# Patient Record
Sex: Male | Born: 1960 | Race: White | Hispanic: No | Marital: Single | Smoking: Never smoker
Health system: Southern US, Community
[De-identification: ages and names within clinical notes are randomized; demographics above are authoritative.]

## PROBLEM LIST (undated history)

## (undated) DIAGNOSIS — K259 Gastric ulcer, unspecified as acute or chronic, without hemorrhage or perforation: Secondary | ICD-10-CM

## (undated) HISTORY — PX: KNEE SURGERY: SHX244

## (undated) HISTORY — PX: HERNIA REPAIR: SHX51

---

## 2015-07-03 ENCOUNTER — Emergency Department (HOSPITAL_COMMUNITY): Payer: Self-pay

## 2015-07-03 ENCOUNTER — Encounter (HOSPITAL_COMMUNITY): Payer: Self-pay

## 2015-07-03 ENCOUNTER — Emergency Department (HOSPITAL_COMMUNITY)
Admission: EM | Admit: 2015-07-03 | Discharge: 2015-07-03 | Discharge status: Against Medical Advice | Payer: Self-pay | Attending: Emergency Medicine | Admitting: Emergency Medicine

## 2015-07-03 DIAGNOSIS — S4991XA Unspecified injury of right shoulder and upper arm, initial encounter: Secondary | ICD-10-CM

## 2015-07-03 DIAGNOSIS — W000XXA Fall on same level due to ice and snow, initial encounter: Secondary | ICD-10-CM | POA: Insufficient documentation

## 2015-07-03 DIAGNOSIS — Y998 Other external cause status: Secondary | ICD-10-CM | POA: Insufficient documentation

## 2015-07-03 DIAGNOSIS — W1789XA Other fall from one level to another, initial encounter: Secondary | ICD-10-CM | POA: Insufficient documentation

## 2015-07-03 DIAGNOSIS — M25511 Pain in right shoulder: Secondary | ICD-10-CM

## 2015-07-03 DIAGNOSIS — Z8719 Personal history of other diseases of the digestive system: Secondary | ICD-10-CM | POA: Insufficient documentation

## 2015-07-03 DIAGNOSIS — R52 Pain, unspecified: Secondary | ICD-10-CM

## 2015-07-03 DIAGNOSIS — Z79899 Other long term (current) drug therapy: Secondary | ICD-10-CM | POA: Insufficient documentation

## 2015-07-03 DIAGNOSIS — Y9289 Other specified places as the place of occurrence of the external cause: Secondary | ICD-10-CM | POA: Insufficient documentation

## 2015-07-03 DIAGNOSIS — Y9389 Activity, other specified: Secondary | ICD-10-CM | POA: Insufficient documentation

## 2015-07-03 DIAGNOSIS — S40911A Unspecified superficial injury of right shoulder, initial encounter: Secondary | ICD-10-CM | POA: Insufficient documentation

## 2015-07-03 DIAGNOSIS — W19XXXA Unspecified fall, initial encounter: Secondary | ICD-10-CM

## 2015-07-03 DIAGNOSIS — T85528A Displacement of other gastrointestinal prosthetic devices, implants and grafts, initial encounter: Secondary | ICD-10-CM

## 2015-07-03 HISTORY — DX: Gastric ulcer, unspecified as acute or chronic, without hemorrhage or perforation: K25.9

## 2015-07-03 LAB — CBC WITH DIFFERENTIAL/PLATELET
BASOS ABS: 0 10*3/uL (ref 0.0–0.1)
Basophils Relative: 0 %
EOS ABS: 0 10*3/uL (ref 0.0–0.7)
EOS PCT: 1 %
HCT: 41.1 % (ref 39.0–52.0)
Hemoglobin: 14 g/dL (ref 13.0–17.0)
LYMPHS PCT: 17 %
Lymphs Abs: 1 10*3/uL (ref 0.7–4.0)
MCH: 32.3 pg (ref 26.0–34.0)
MCHC: 34.1 g/dL (ref 30.0–36.0)
MCV: 94.9 fL (ref 78.0–100.0)
MONO ABS: 0.5 10*3/uL (ref 0.1–1.0)
Monocytes Relative: 8 %
Neutro Abs: 4.5 10*3/uL (ref 1.7–7.7)
Neutrophils Relative %: 74 %
PLATELETS: 170 10*3/uL (ref 150–400)
RBC: 4.33 MIL/uL (ref 4.22–5.81)
RDW: 12.7 % (ref 11.5–15.5)
WBC: 6.1 10*3/uL (ref 4.0–10.5)

## 2015-07-03 LAB — I-STAT CHEM 8, ED
BUN: 15 mg/dL (ref 6–20)
CALCIUM ION: 1.17 mmol/L (ref 1.12–1.23)
CREATININE: 1 mg/dL (ref 0.61–1.24)
Chloride: 107 mmol/L (ref 101–111)
GLUCOSE: 103 mg/dL — AB (ref 65–99)
HCT: 43 % (ref 39.0–52.0)
HEMOGLOBIN: 14.6 g/dL (ref 13.0–17.0)
Potassium: 4.1 mmol/L (ref 3.5–5.1)
Sodium: 142 mmol/L (ref 135–145)
TCO2: 24 mmol/L (ref 0–100)

## 2015-07-03 MED ORDER — IOHEXOL 300 MG/ML  SOLN
75.0000 mL | Freq: Once | INTRAMUSCULAR | Status: AC | PRN
  Administered 2015-07-03: 75 mL via INTRAVENOUS

## 2015-07-03 MED ORDER — HYDROMORPHONE HCL 2 MG/ML IJ SOLN
2.0000 mg | Freq: Once | INTRAMUSCULAR | Status: AC
  Administered 2015-07-03: 2 mg via INTRAVENOUS
  Filled 2015-07-03: qty 1

## 2015-07-03 MED ORDER — HYDROMORPHONE HCL 1 MG/ML IJ SOLN
1.0000 mg | Freq: Once | INTRAMUSCULAR | Status: AC
  Administered 2015-07-03: 1 mg via INTRAVENOUS
  Filled 2015-07-03: qty 1

## 2015-07-03 MED ORDER — FENTANYL CITRATE (PF) 100 MCG/2ML IJ SOLN
100.0000 ug | Freq: Once | INTRAMUSCULAR | Status: AC
  Administered 2015-07-03: 100 ug via INTRAVENOUS
  Filled 2015-07-03: qty 2

## 2015-07-03 MED ORDER — PROPOFOL 10 MG/ML IV BOLUS
20.0000 mg | Freq: Once | INTRAVENOUS | Status: AC
  Administered 2015-07-03: 70 mg via INTRAVENOUS
  Filled 2015-07-03: qty 20

## 2015-07-03 MED ORDER — ONDANSETRON HCL 4 MG/2ML IJ SOLN
4.0000 mg | Freq: Once | INTRAMUSCULAR | Status: AC
  Administered 2015-07-03: 4 mg via INTRAVENOUS
  Filled 2015-07-03: qty 2

## 2015-07-03 NOTE — ED Notes (Signed)
Awake. Verbally responsive. A/O x4. Resp even and unlabored. No audible adventitious breath sounds noted. ABC's intact. IV saline lock patent and intact. 

## 2015-07-03 NOTE — ED Notes (Signed)
Pt reported rt shoulder pain s/p to falling off back of truck. Noted deformity to rt shoulder, no bruising/swelling, (+)PMS, CRT brisk, LROM.

## 2015-07-03 NOTE — ED Notes (Signed)
CARELINK CALLED TO CANCEL [facility]SP[facility].

## 2015-07-03 NOTE — ED Notes (Signed)
CALL PLACED TO RONIE, CHARGE [facility]. RONIE INFORMED OF PT LEAVING THE FACILITY.

## 2015-07-03 NOTE — ED Notes (Signed)
[facility]

## 2015-07-03 NOTE — ED Notes (Signed)
UPON REEVALUATING THE PT, PT'S ROOM FOUND EMPTY WITH ALL BELONGINGS GONE. WILL RECHECK THE ROOM.

## 2015-07-03 NOTE — ED Notes (Signed)
Pt taken to CT scan and returned to room without distress noted. 

## 2015-07-03 NOTE — ED Notes (Addendum)
Patient fell on ice this AM. C/o right shoulder pain.

## 2015-07-03 NOTE — ED Notes (Addendum)
Pt signed consent form for reducing of rt scapula. Pt noted to have swelling of rt hand. (+)PMS, CRT brisk.

## 2015-07-03 NOTE — ED Provider Notes (Signed)
CSN: [id number]     Arrival date & time 07/03/15  0840 History   First MD Initiated Contact with Patient 07/03/15 [phone]     Chief Complaint  Patient presents with  . Shoulder Pain     HPI Patient fell off a truck and landed on right shoulder.  Presents with deformity to right shoulder.  No other complaints.  Denies hitting his head or neck.  Patient's had a history of rotator cuff surgery to same shoulder many years ago. Past Medical History  Diagnosis Date  . Multiple gastric ulcers    Past Surgical History  Procedure Laterality Date  . Hernia repair    . Knee surgery     Family History  Problem Relation Age of Onset  . Emphysema Father    Social History  Substance Use Topics  . Smoking status: Never Smoker   . Smokeless tobacco: Never Used  . Alcohol Use: No    Review of Systems  All other systems reviewed and are negative  Allergies  Review of patient's allergies indicates no known allergies.  Home Medications   Prior to Admission medications   Medication Sig Start Date End Date Taking? Authorizing Provider  LISINOPRIL PO Take 1 tablet by mouth daily.   Yes Historical Provider, MD   BP 143/95 mmHg  Pulse 77  Temp(Src) 98 F (36.7 C) (Oral)  Resp 16  SpO2 95% Physical Exam  Constitutional: He is oriented to person, place, and time. He appears well-developed and well-nourished. No distress.  HENT:  Head: Normocephalic and atraumatic.  Eyes: Pupils are equal, round, and reactive to light.  Neck: Normal range of motion.  Cardiovascular: Normal rate and intact distal pulses.   Pulses:      Radial pulses are 3+ on the right side, and 3+ on the left side.  Pulmonary/Chest: No respiratory distress.  Abdominal: Normal appearance. He exhibits no distension.  Musculoskeletal:       Right shoulder: He exhibits decreased range of motion and deformity.  No sensory or motor deficit to exam of right hand.  Obvious deformity to the right scapula along the posterior  thorax.  After approximately 2 hours right hand began to swell but continued to have good pulses and sensory exam was normal with no definite motor defect.  Neurological: He is alert and oriented to person, place, and time. No cranial nerve deficit.  Skin: Skin is warm and dry. No rash noted.  Psychiatric: He has a normal mood and affect. His behavior is normal.  Nursing note and vitals reviewed.   ED Course  .Sedation Date/Time: 07/03/2015 3:29 PM Performed by: Nelva NayBEATON, Deanndra Kirley Authorized by: Nelva NayBEATON, Kimmora Risenhoover  Consent:    Consent obtained:  Verbal and written   Consent given by:  Patient Indications:    Sedation purpose:  Dislocation reduction   Procedure necessitating sedation performed by:  Physician performing sedation   Intended level of sedation:  Moderate (conscious sedation) Pre-sedation assessment:    Pre-sedation assessments completed and reviewed: airway patency and mental status   Immediate pre-procedure details:    Reassessment: Patient reassessed immediately prior to procedure   Procedure details (see MAR for exact dosages):    Preoxygenation:  Nasal cannula   Sedation:  Propofol   Analgesia:  Fentanyl   Intra-procedure monitoring:  Blood pressure monitoring, cardiac monitor, continuous pulse oximetry and frequent vital sign checks Post-procedure details:    Attendance: Constant attendance by certified staff until patient recovered     Recovery: Patient returned to  pre-procedure baseline     Patient is stable for discharge or admission: Yes     Patient tolerance:  Tolerated well, no immediate complications  (including critical care time)  Medications  ondansetron (ZOFRAN) injection 4 mg (4 mg Intravenous Given 07/03/15 0943)  HYDROmorphone (DILAUDID) injection 2 mg (2 mg Intravenous Given 07/03/15 0943)  fentaNYL (SUBLIMAZE) injection 100 mcg (100 mcg Intravenous Given 07/03/15 1023)  iohexol (OMNIPAQUE) 300 MG/ML solution 75 mL (75 mLs Intravenous Contrast Given 07/03/15 1146)   HYDROmorphone (DILAUDID) injection 1 mg (1 mg Intravenous Given 07/03/15 1207)  propofol (DIPRIVAN) 10 mg/mL bolus/IV push 20 mg (0 mg Intravenous Stopped 07/03/15 1421)  fentaNYL (SUBLIMAZE) injection 100 mcg (100 mcg Intravenous Given 07/03/15 1413)  HYDROmorphone (DILAUDID) injection 2 mg (2 mg Intravenous Given 07/03/15 1621)    Labs Review Labs Reviewed  I-STAT CHEM 8, ED - Abnormal; Notable for the following:    Glucose, Bld 103 (*)    All other components within normal limits  CBC WITH DIFFERENTIAL/PLATELET    Imaging Review No results found. I have personally reviewed and evaluated these images and lab results as part of my medical decision-making. I discussed with the orthopedic surgeon who reviewed both plain films and CT scan.  He recommended patient be transferred to [facility].  Dr. Cherly Hensen from the trauma service was contacted and accepted the patient.  Will send the patient to the emergency room where Dr. Cherly Hensen will be called.   MDM   Final diagnoses:  Fall  Injury of scapular region, right, initial encounter        Nelva Nay, MD 07/13/15 2202

## 2015-07-03 NOTE — ED Notes (Addendum)
SECOND CHECK OF PT'S ROOM EMPTY. WAITING ROOM LOBBY AND OUTSIDE CHECKED WITH NO SIGN OF THE PT. 2 ATTEMPTS TO CALL THE PT, THE PHONE NUMBER ON FILE IS DISCONNECTED. THERE IS ALSO NO SIGN OF THE [facility] PERIPHERAL IV IN THE ROOM. CHARGE NURSE MADE AWARE.

## 2015-07-03 NOTE — ED Notes (Addendum)
Dr. Radford PaxBeaton and Dr. Fayrene FearingJames and Livia SnellenBenedict, ortho tech at bedside to reduce rt scapula. At At 1421 given Propofol 70mg  IV push via Dr. Radford PaxBeaton. MD's attempted to reduce rt scapula and was unsuccessful. At 1426 Pt awake. Verbally responsive. A/O x4. Resp even and unlabored. No audible adventitious breath sounds noted. SR on monitor. ABC's intact. IV saline lock patent and intact.VSS.

## 2017-07-01 IMAGING — CT CT SHOULDER*R* W/O CM
3 series · 15 of 33 positions shown, 18 images · non-contrast
Comparison: None.

CLINICAL DATA: Status post fall on ice this morning. Right shoulder
pain and retracted right scapula.

EXAM:
CT OF THE RIGHT SHOULDER WITHOUT CONTRAST
TECHNIQUE: Multidetector CT imaging was performed according to the standard
protocol. Multiplanar CT image reconstructions were also generated.

[Series 3: pelvis st · axial · 0.45mm/px · z∈[+1619,+1789]mm · 7 of 101 slices shown, 9 images]
[im 8/101  soft-tissue]
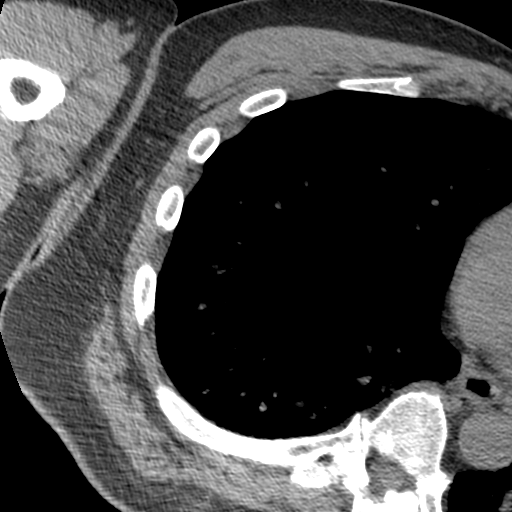
[im 8/101  bone]
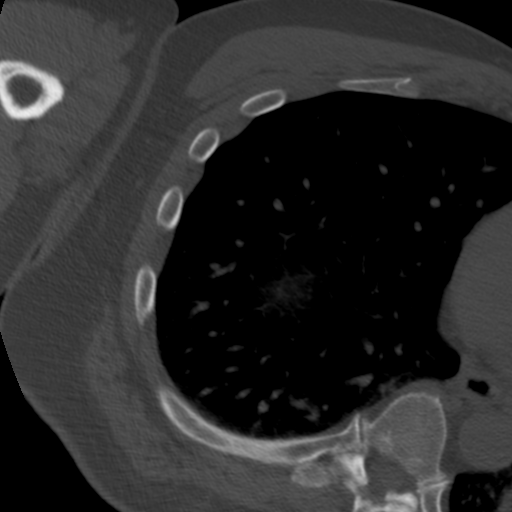
[im 24/101  bone]
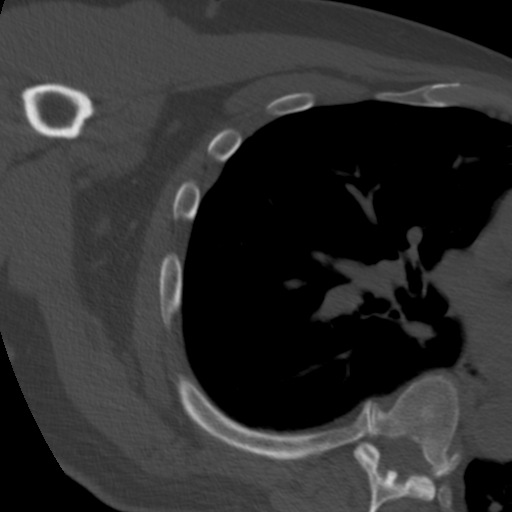
[im 39/101  bone]
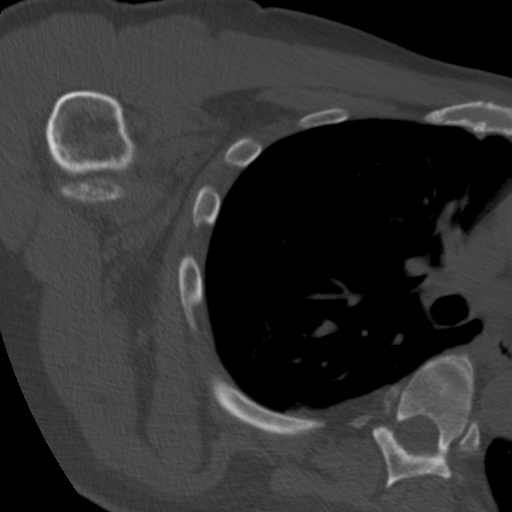
[im 54/101  bone]
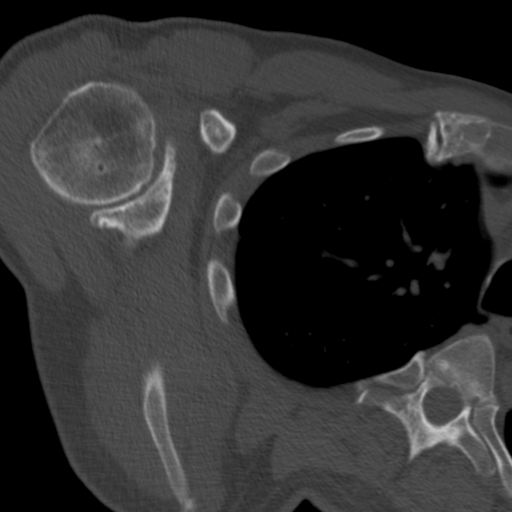
[im 62/101  soft-tissue]
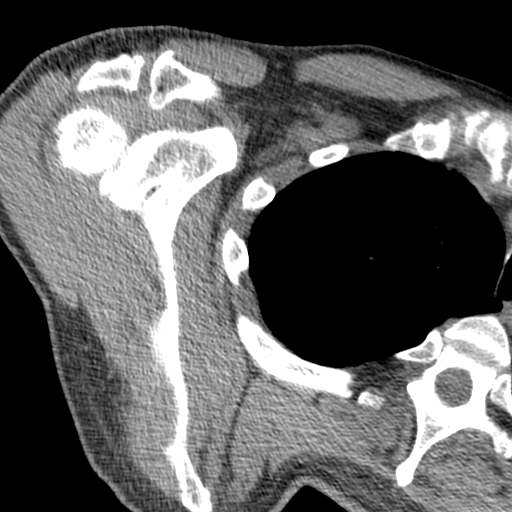
[im 62/101  bone]
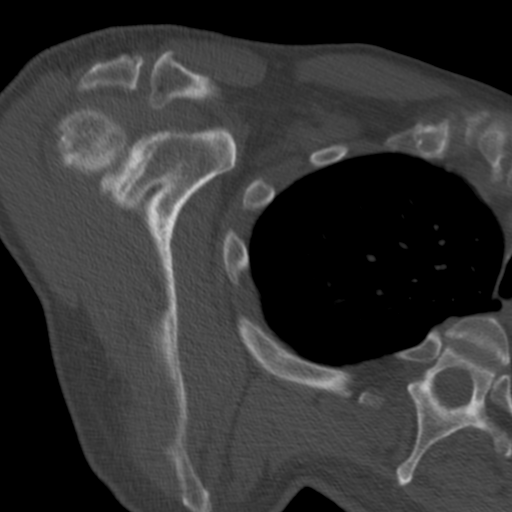
[im 77/101  bone]
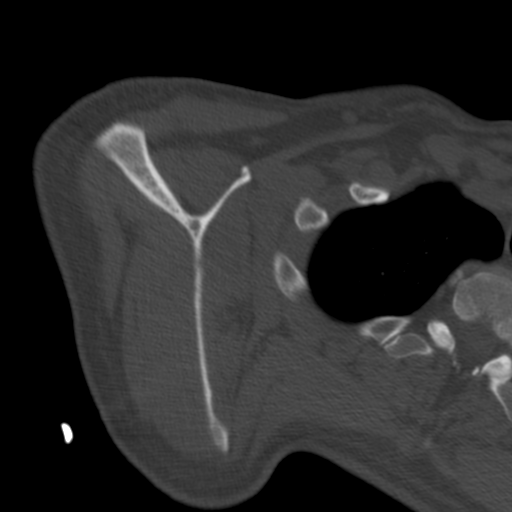
[im 93/101  bone]
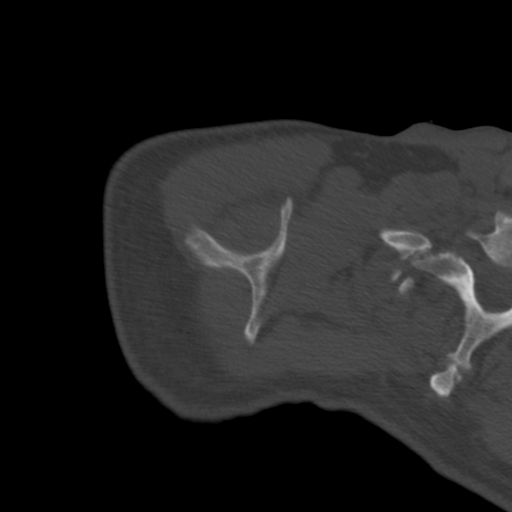

[Series 7: coronal images · coronal · 0.39mm/px · 3 of 131 slices shown]
[im 27/131  bone]
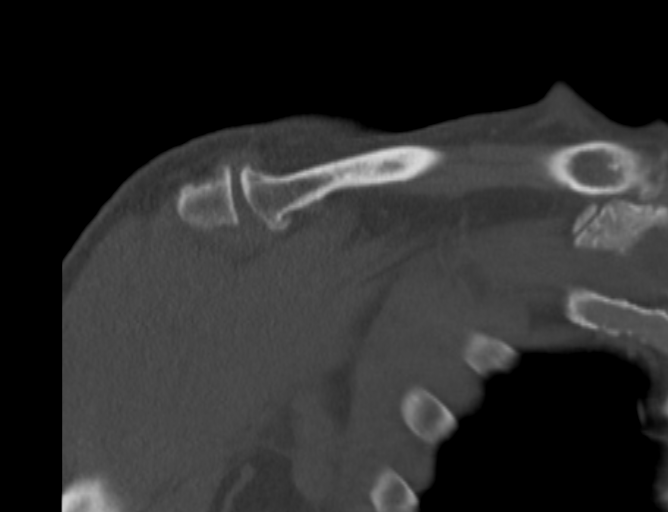
[im 53/131  bone]
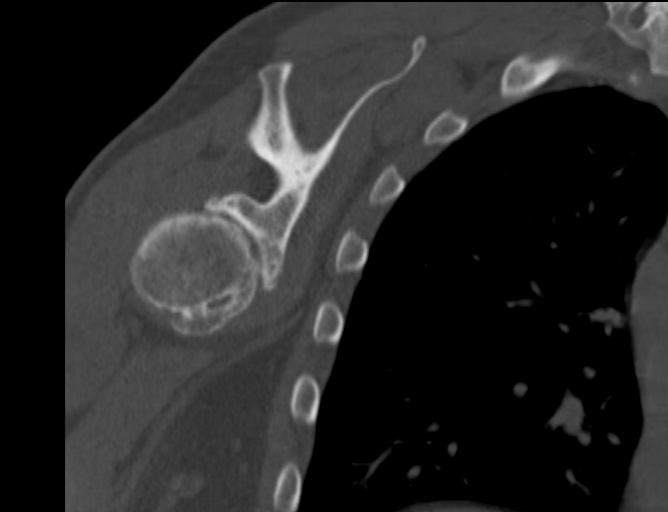
[im 79/131  bone]
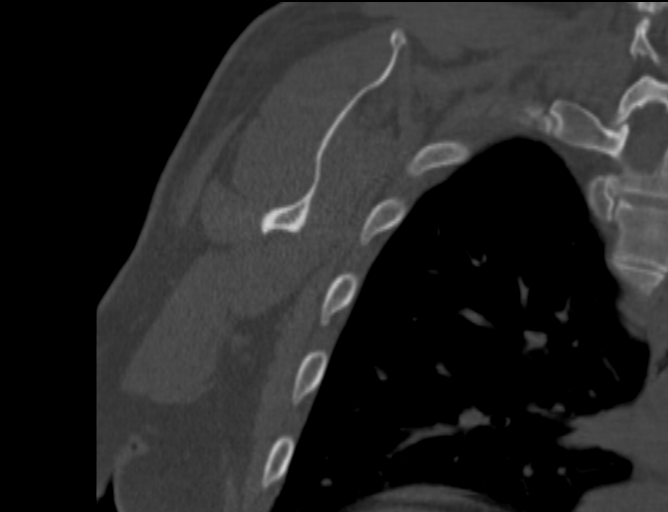

[Series 8: sagittal images · sagittal · 0.38mm/px · 5 of 126 slices shown, 6 images]
[im 42/126  bone]
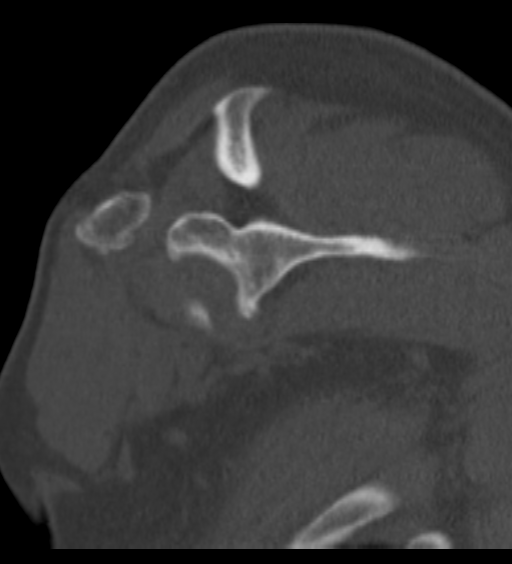
[im 53/126  bone]
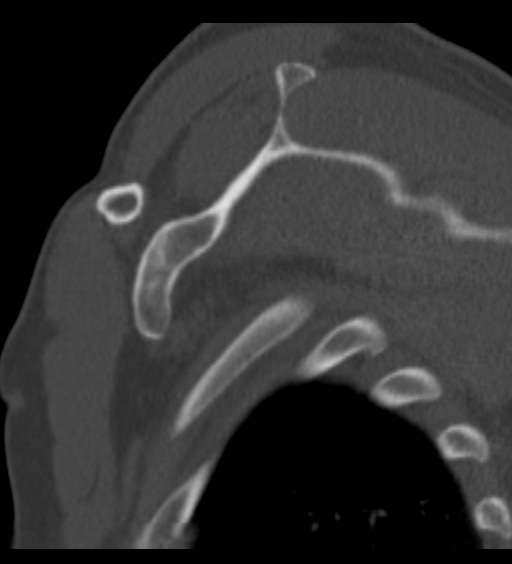
[im 63/126  soft-tissue]
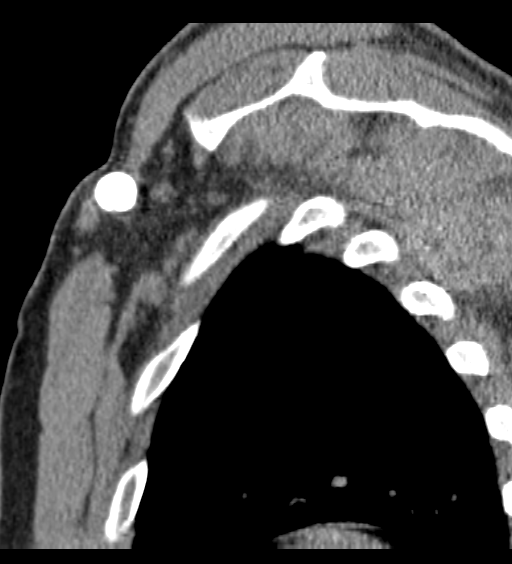
[im 63/126  bone]
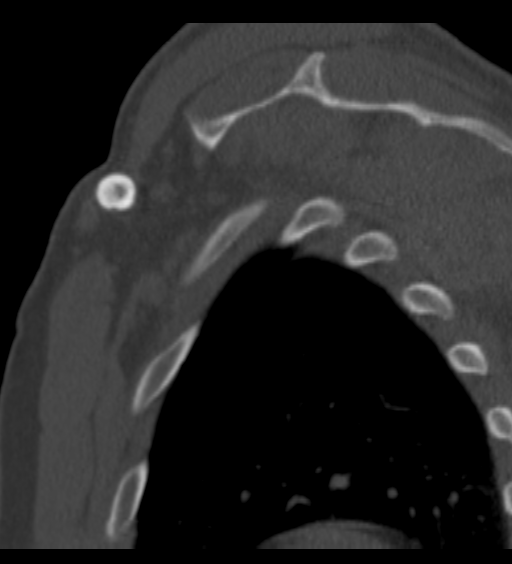
[im 73/126  bone]
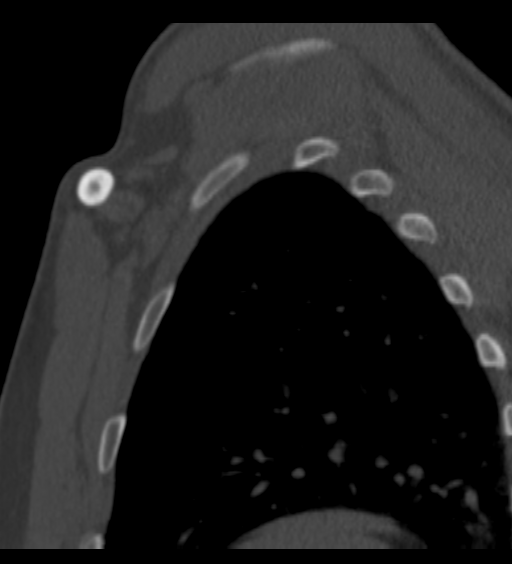
[im 84/126  bone]
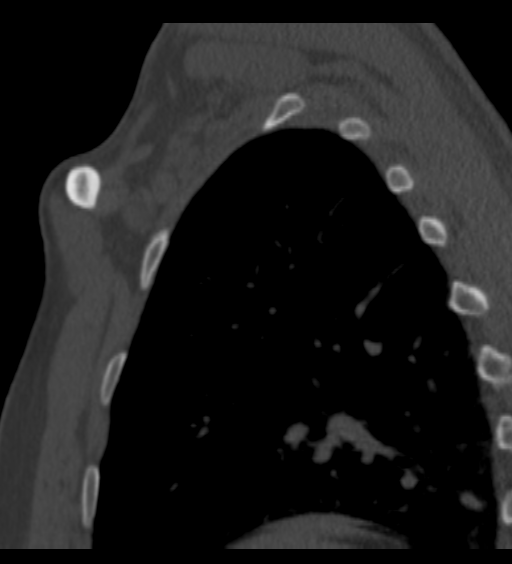

[15 of 33 positions shown; findings below may reference images not displayed]

FINDINGS: There is no fracture. The humeral head is located. The patient has
advanced for age glenohumeral osteoarthritis with joint space
narrowing and remodeling of the glenoid. Acromioclavicular
osteoarthritis is also seen. The medial border of the scapula is
laterally rotated or "winged". All imaged musculature appears
intact. Imaged lung parenchyma is clear.
IMPRESSION: Negative for fracture.

Winging of the right scapula is likely a chronic abnormality and is
typically seen in injuries to the long thoracic nerve which can be
traumatic or idiopathic.

Advanced for age glenohumeral osteoarthritis. Acromioclavicular
osteoarthritis is also seen.

## 2017-07-01 IMAGING — CR DG SHOULDER 2+V*R*
3 series · 3 of 3 positions shown · non-contrast
Comparison: None.

CLINICAL DATA: Patient with diffuse right shoulder pain status post
fall. Initial encounter.

EXAM:
RIGHT SHOULDER - 2+ VIEW

[w shoulder external right]
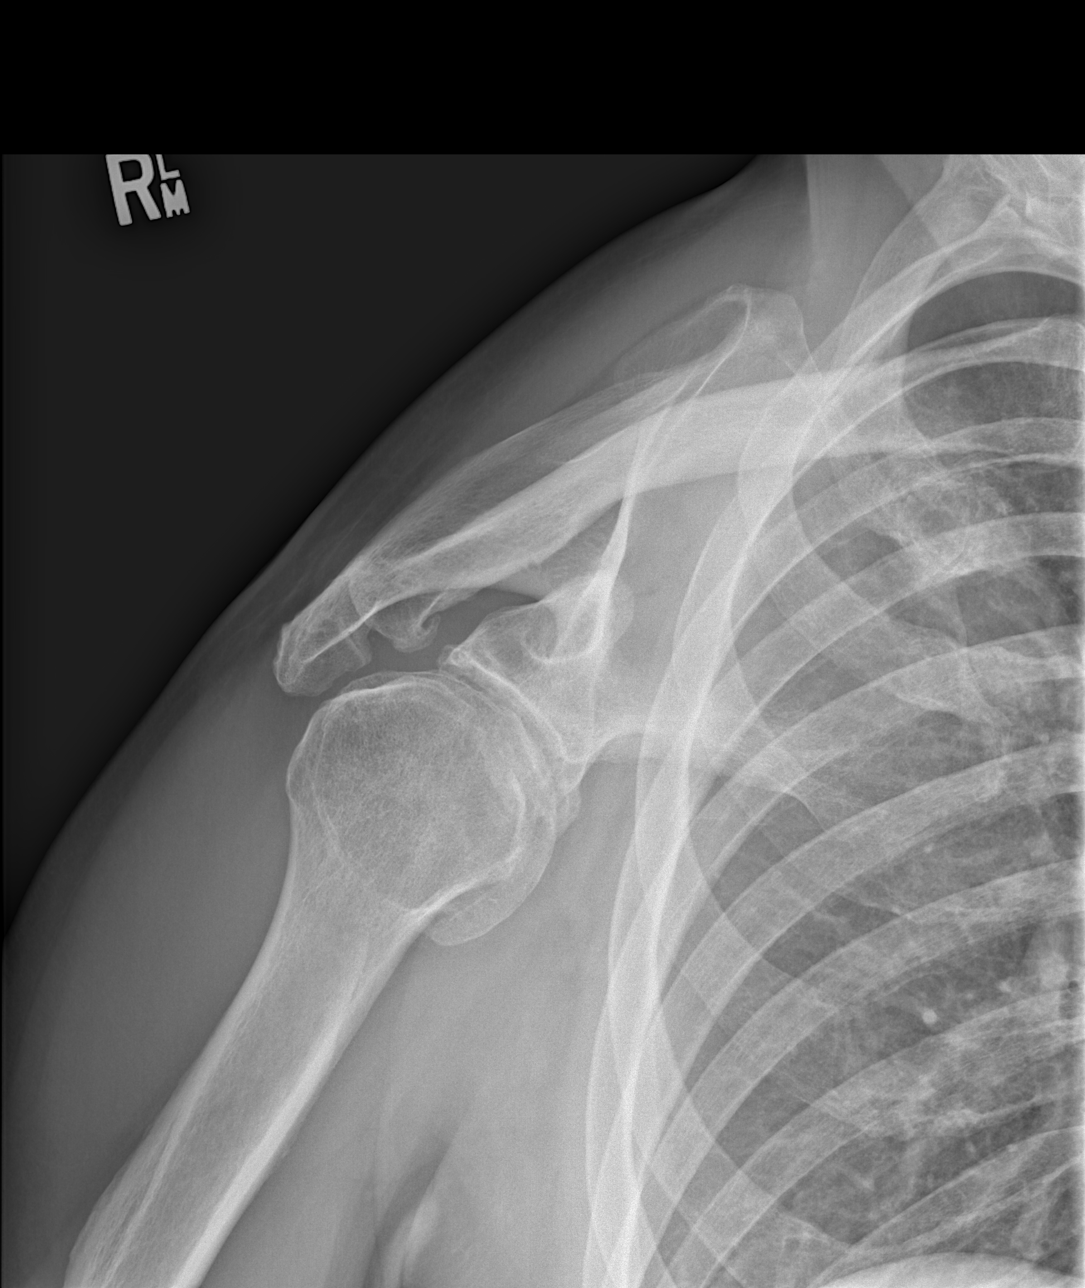

[w shoulder y-view right (1 of 2)]
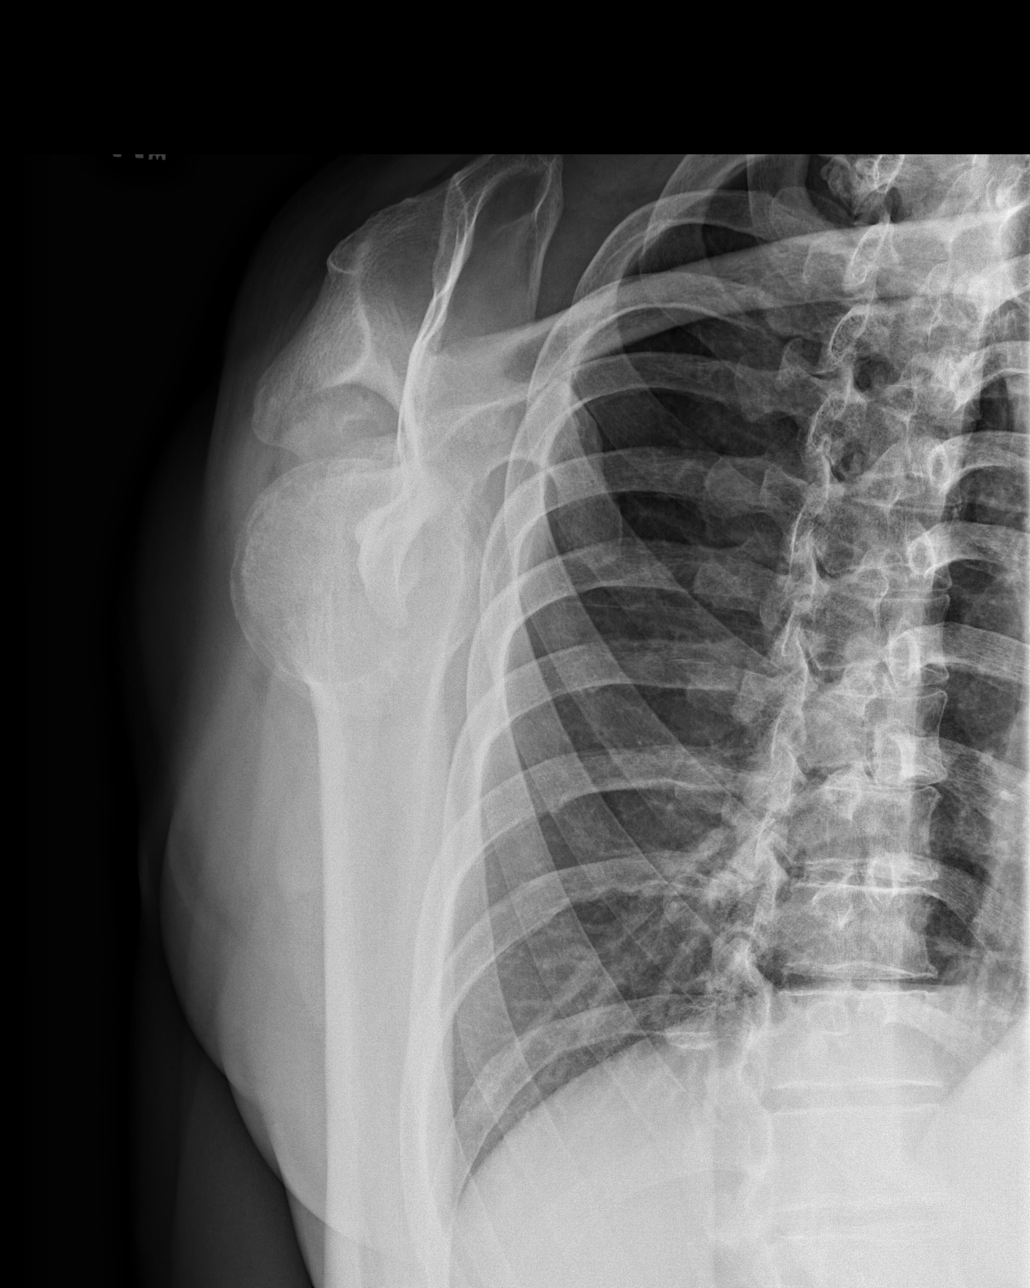

[w shoulder y-view right (2 of 2)]
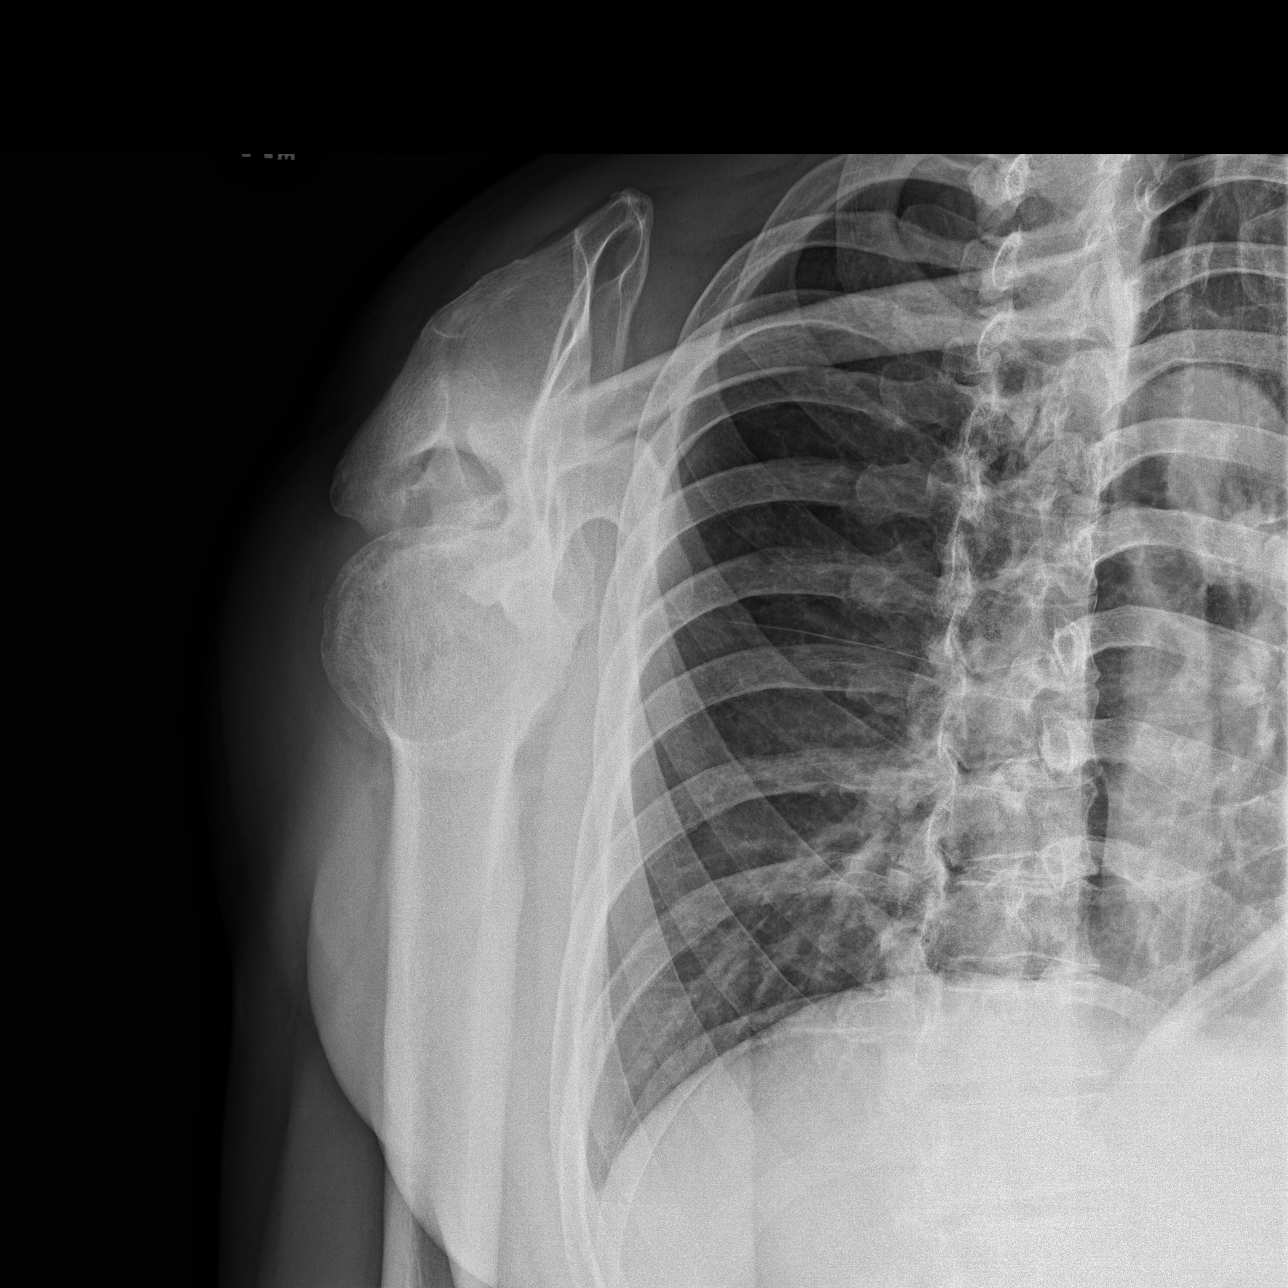

[3 of 3 positions shown; findings below may reference images not displayed]

FINDINGS: There appears to be abnormal positioning of the scapula relative to
the chest wall. No definite evidence for acute fracture. Right
shoulder joint degenerative changes. Visualize right hemi-thorax is
unremarkable.
IMPRESSION: There appears to be abnormal position and increased space between
the scapular body and the chest wall which is nonspecific however
may be secondary to scapular dislocation, potentially secondary to
ligamentous or muscular injury.
